# Patient Record
Sex: Male | Born: 1990 | Race: White | Hispanic: No | Marital: Single | Smoking: Former smoker
Health system: Southern US, Community
[De-identification: ages and names within clinical notes are randomized; demographics above are authoritative.]

## PROBLEM LIST (undated history)

## (undated) DIAGNOSIS — R55 Syncope and collapse: Secondary | ICD-10-CM

## (undated) DIAGNOSIS — S060X9A Concussion with loss of consciousness of unspecified duration, initial encounter: Secondary | ICD-10-CM

## (undated) DIAGNOSIS — B019 Varicella without complication: Secondary | ICD-10-CM

## (undated) HISTORY — DX: Concussion with loss of consciousness of unspecified duration, initial encounter: S06.0X9A

## (undated) HISTORY — DX: Syncope and collapse: R55

## (undated) HISTORY — PX: NO PAST SURGERIES: SHX2092

## (undated) HISTORY — DX: Varicella without complication: B01.9

---
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
## 2011-01-08 ENCOUNTER — Emergency Department (HOSPITAL_COMMUNITY): Payer: 59

## 2011-01-08 ENCOUNTER — Emergency Department (HOSPITAL_COMMUNITY)
Admission: EM | Admit: 2011-01-08 | Discharge: 2011-01-08 | Disposition: A | Discharge status: Home / Self Care | Payer: 59 | Attending: Emergency Medicine | Admitting: Emergency Medicine

## 2011-01-08 DIAGNOSIS — R109 Unspecified abdominal pain: Secondary | ICD-10-CM | POA: Insufficient documentation

## 2011-01-08 DIAGNOSIS — S0990XA Unspecified injury of head, initial encounter: Secondary | ICD-10-CM | POA: Insufficient documentation

## 2011-01-08 DIAGNOSIS — R079 Chest pain, unspecified: Secondary | ICD-10-CM | POA: Insufficient documentation

## 2011-01-08 DIAGNOSIS — S022XXA Fracture of nasal bones, initial encounter for closed fracture: Secondary | ICD-10-CM | POA: Insufficient documentation

## 2011-01-08 DIAGNOSIS — S0230XA Fracture of orbital floor, unspecified side, initial encounter for closed fracture: Secondary | ICD-10-CM | POA: Insufficient documentation

## 2011-01-08 DIAGNOSIS — S01119A Laceration without foreign body of unspecified eyelid and periocular area, initial encounter: Secondary | ICD-10-CM | POA: Insufficient documentation

## 2011-01-08 LAB — COMPREHENSIVE METABOLIC PANEL
Albumin: 4.6 g/dL (ref 3.5–5.2)
BUN: 15 mg/dL (ref 6–23)
CO2: 24 mEq/L (ref 19–32)
Chloride: 106 mEq/L (ref 96–112)
Creatinine, Ser: 1.1 mg/dL (ref 0.4–1.5)
GFR calc non Af Amer: >60 mL/min (ref >60)
Glucose, Bld: 87 mg/dL (ref 70–99)
Total Bilirubin: 0.8 mg/dL (ref 0.3–1.2)

## 2011-01-08 LAB — CBC
HCT: 47.5 % (ref 39.0–52.0)
Hemoglobin: 15.9 g/dL (ref 13.0–17.0)
RBC: 4.99 MIL/uL (ref 4.22–5.81)
WBC: 14.8 10*3/uL — ABNORMAL HIGH (ref 4.0–10.5)

## 2011-01-08 LAB — DIFFERENTIAL
Basophils Absolute: 0 10*3/uL (ref 0.0–0.1)
Lymphocytes Relative: 11 % — ABNORMAL LOW (ref 12–46)
Neutro Abs: 12.1 10*3/uL — ABNORMAL HIGH (ref 1.7–7.7)
Neutrophils Relative %: 82 % — ABNORMAL HIGH (ref 43–77)

## 2011-01-08 LAB — URINALYSIS, ROUTINE W REFLEX MICROSCOPIC
Glucose, UA: NEGATIVE mg/dL
Hgb urine dipstick: NEGATIVE
Specific Gravity, Urine: 1.025 (ref 1.005–1.030)

## 2011-01-08 MED ORDER — IOHEXOL 300 MG/ML  SOLN
100.0000 mL | Freq: Once | INTRAMUSCULAR | Status: AC | PRN
  Administered 2011-01-08: 100 mL via INTRAVENOUS

## 2011-06-22 LAB — BASIC METABOLIC PANEL
BUN: 14
CO2: 30
Chloride: 104
Creatinine, Ser: 1.06
GFR calc Af Amer: >60
Potassium: 3.9

## 2011-06-22 LAB — RAPID URINE DRUG SCREEN, HOSP PERFORMED
Amphetamines: NOT DETECTED
Barbiturates: NOT DETECTED
Benzodiazepines: POSITIVE — AB
Opiates: POSITIVE — AB

## 2011-06-22 LAB — CBC
HCT: 43.5
MCHC: 34.1
MCV: 90.4
RBC: 4.82
WBC: 9.6

## 2011-06-22 LAB — DIFFERENTIAL
Basophils Relative: 0
Eosinophils Absolute: 0.3
Eosinophils Relative: 3
Lymphs Abs: 3
Monocytes Absolute: 0.8
Monocytes Relative: 8
Neutrophils Relative %: 56
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
## 2018-07-04 DIAGNOSIS — S060X9A Concussion with loss of consciousness of unspecified duration, initial encounter: Secondary | ICD-10-CM

## 2018-07-04 DIAGNOSIS — R55 Syncope and collapse: Secondary | ICD-10-CM

## 2018-07-04 DIAGNOSIS — S060XAA Concussion with loss of consciousness status unknown, initial encounter: Secondary | ICD-10-CM

## 2018-07-04 HISTORY — DX: Concussion with loss of consciousness status unknown, initial encounter: S06.0XAA

## 2018-07-04 HISTORY — DX: Concussion with loss of consciousness of unspecified duration, initial encounter: S06.0X9A

## 2018-07-04 HISTORY — DX: Syncope and collapse: R55

## 2018-07-11 ENCOUNTER — Ambulatory Visit (INDEPENDENT_AMBULATORY_CARE_PROVIDER_SITE_OTHER): Payer: Self-pay | Admitting: Family Medicine

## 2018-07-11 ENCOUNTER — Encounter: Payer: Self-pay | Admitting: Family Medicine

## 2018-07-11 ENCOUNTER — Encounter

## 2018-07-11 VITALS — BP 125/86 | HR 69 | Temp 97.9°F | Resp 20 | Ht 72.0 in | Wt 213.0 lb

## 2018-07-11 DIAGNOSIS — R7309 Other abnormal glucose: Secondary | ICD-10-CM

## 2018-07-11 DIAGNOSIS — Z7689 Persons encountering health services in other specified circumstances: Secondary | ICD-10-CM

## 2018-07-11 DIAGNOSIS — E162 Hypoglycemia, unspecified: Secondary | ICD-10-CM

## 2018-07-11 DIAGNOSIS — R55 Syncope and collapse: Secondary | ICD-10-CM

## 2018-07-11 DIAGNOSIS — Z23 Encounter for immunization: Secondary | ICD-10-CM

## 2018-07-11 DIAGNOSIS — S060X1A Concussion with loss of consciousness of 30 minutes or less, initial encounter: Secondary | ICD-10-CM | POA: Insufficient documentation

## 2018-07-11 LAB — COMPREHENSIVE METABOLIC PANEL
ALBUMIN: 4.7 g/dL (ref 3.5–5.2)
ALT: 22 U/L (ref 0–53)
AST: 18 U/L (ref 0–37)
Alkaline Phosphatase: 76 U/L (ref 39–117)
BUN: 25 mg/dL — AB (ref 6–23)
CHLORIDE: 101 meq/L (ref 96–112)
CO2: 27 meq/L (ref 19–32)
Calcium: 9.8 mg/dL (ref 8.4–10.5)
Creatinine, Ser: 1.09 mg/dL (ref 0.40–1.50)
GFR: 84.82 mL/min (ref >60.00)
GLUCOSE: 84 mg/dL (ref 70–99)
POTASSIUM: 4.4 meq/L (ref 3.5–5.1)
SODIUM: 138 meq/L (ref 135–145)
Total Bilirubin: 0.6 mg/dL (ref 0.2–1.2)
Total Protein: 7.7 g/dL (ref 6.0–8.3)

## 2018-07-11 LAB — HEMOGLOBIN A1C: HEMOGLOBIN A1C: 5.4 % (ref 4.6–6.5)

## 2018-07-11 LAB — TSH: TSH: 0.64 u[IU]/mL (ref 0.35–4.50)
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
## 2018-07-11 NOTE — Patient Instructions (Addendum)
Rest- off work 2 days. Follow up in 1 week.  Make neurology appt, if stable in 1 week we will cancel the neuro referral.    report to Hospital ED immediately if experience:  - Have a headache that gets worse - Are very drowsy or can not be awakened  - Can not recognize people or faces - Have repeated vomiting - behave unusually or seems confused; or very irritable.  - Develop seizures - have weak or numb arm/legs - unsteady on your feet - slurred speech  Return to activity: Patients should not return to any activity the same day as injury. When returning patients to activity they should follow a stepwise approach by stages of progression. There should be 24 hours between stages and patient return to stage 1 if symptoms recur.  1. Rest until asymptomatic (physically and mentally)--> No activity. No screen time or any activity that requires reading and concentration.  2. Light aerobic exercise tolerated (stationary bike etc) 3. Sport  specific exercise or work specific environment For athletes only:  4. Non-contact training drills--> medical clearance should be obtained if needed after this stage achieved.  5. Full contact training 6. Return to competition   Concussion, Adult A concussion is a brain injury from a direct hit (blow) to the head or body. This injury causes the brain to shake quickly back and forth inside the skull. It is caused by:  A hit to the head.  A quick and sudden movement (jolt) of the head or neck.  How fast you will get better from a concussion depends on many things like how bad your concussion was, what part of your brain was hurt, how old you are, and how healthy you were before the concussion. Recovery can take time. It is important to wait to return to activity until a doctor says it is safe and your symptoms are all gone. Follow these instructions at home: Activity  Limit activities that need a lot of thought or concentration. These  include: ? Homework or work for your job. ? Watching TV. ? Computer work. ? Playing memory games and puzzles.  Rest. Rest helps the brain to heal. Make sure you: ? Get plenty of sleep at night. Do not stay up late. ? Go to bed at the same time every day. ? Rest during the day. Take naps or rest breaks when you feel tired.  It can be dangerous if you get another concussion before the first one has healed Do not do activities that could cause a second concussion, such as riding a bike or playing sports.  Ask your doctor when you can return to your normal activities, like driving, riding a bike, or using machinery. Your ability to react may be slower. Do not do these activities if you are dizzy. Your doctor will likely give you a plan for slowly going back to activities. General instructions  Take over-the-counter and prescription medicines only as told by your doctor.  Do not drink alcohol until your doctor says you can.  If it is harder than usual to remember things, write them down.  If you are easily distracted, try to do one thing at a time. For example, do not try to watch TV while making dinner.  Talk with family members or close friends when you need to make important decisions.  Watch your symptoms and tell other people to do the same. Other problems (complications) can happen after a concussion. Older adults with a brain injury  may have a higher risk of serious problems, such as a blood clot in the brain.  Tell your teachers, school nurse, school counselor, coach, Event organiser, or work Production designer, theatre/television/film about your injury and symptoms. Tell them about what you can or cannot do. They should watch for: ? More problems with attention or concentration. ? More trouble remembering or learning new information. ? More time needed to do tasks or assignments. ? Being more annoyed (irritable) or having a harder time dealing with stress. ? Any other symptoms that get worse.  Keep all follow-up  visits as told by your health care provider. This is important. Prevention  It is very important that you donot get another brain injury, especially before you have healed. In rare cases, another injury can cause permanent brain damage, brain swelling, or death. You have the most risk if you get another head injury in the first 7-10 days after you were hurt before. To avoid injuries: ? Wear a seat belt when you ride in a car. ? Do not drink too much alcohol. ? Avoid activities that could make you get a second concussion, like contact sports. ? Wear a helmet when you do activities like:  Biking.  Skiing.  Skateboarding.  Skating. ? Make your home safe by:  Removing things from the floor or stairs that could make you trip.  Using grab bars in bathrooms and handrails by stairs.  Placing non-slip mats on floors and in bathtubs.  Putting more light in dark areas. Contact a doctor if:  Your symptoms get worse.  You have new symptoms.  You keep having symptoms for more than 2 weeks. Get help right away if:  You have bad headaches, or your headaches get worse.  You have weakness in any part of your body.  You have loss of feeling (numbness).  You feel off balance.  You keep throwing up (vomiting).  You feel more sleepy.  The black center of one eye (pupil) is bigger than the other one.  You twitch or shake violently (convulse) or have a seizure.  Your speech is not clear (is slurred).  You feel more tired, more confused, or more annoyed.  You do not recognize people or places.  You have neck pain.  It is hard to wake you up.  You have strange behavior changes.  You pass out (lose consciousness). Summary  A concussion is a brain injury from a direct hit (blow) to the head or body.  This condition is treated with rest and careful watching of symptoms.  If you keep having symptoms for more than 2 weeks, call your doctor. This information is not intended to  replace advice given to you by your health care provider. Make sure you discuss any questions you have with your health care provider. Document Released: 08/18/2009 Document Revised: 08/14/2016 Document Reviewed: 08/14/2016 Elsevier Interactive Patient Education  2017 [employer].    Other important points:  - no alcohol.  - no sleeping tablets - Use Tylenol or codeine for headache only. Do NOT use aspirin or anti-inflammatory medication.  - Do NOT drive until cleared.

## 2018-07-12 ENCOUNTER — Encounter: Payer: Self-pay | Admitting: Family Medicine

## 2018-07-18 ENCOUNTER — Ambulatory Visit: Payer: Self-pay | Admitting: Family Medicine

## 2018-07-18 DIAGNOSIS — Z0289 Encounter for other administrative examinations: Secondary | ICD-10-CM
